# Patient Record
Sex: Female | Born: 1990 | Race: White | Hispanic: No | Marital: Single | Smoking: Former smoker
Health system: Southern US, Community
[De-identification: ages and names within clinical notes are randomized; demographics above are authoritative.]

## PROBLEM LIST (undated history)

## (undated) DIAGNOSIS — R112 Nausea with vomiting, unspecified: Secondary | ICD-10-CM

## (undated) DIAGNOSIS — Z9889 Other specified postprocedural states: Secondary | ICD-10-CM

## (undated) HISTORY — PX: WISDOM TOOTH EXTRACTION: SHX21

## (undated) HISTORY — PX: TONSILLECTOMY: SUR1361

---
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
## 2012-04-10 IMAGING — CT CT HEAD W/O CM
3 of 7 series · 15 of 47 positions shown, 18 images · non-contrast
Comparison: None.

CT HEAD

CLINICAL DATA: Softball injury with pain in the right zygomatic
arch region.

CT HEAD WITHOUT CONTRAST
CT MAXILLOFACIAL WITHOUT CONTRAST
TECHNIQUE: Multidetector CT imaging of the head and maxillofacial
structures were performed using the standard protocol without
intravenous contrast. Multiplanar CT image reconstructions of the
maxillofacial structures were also generated.

[Series 4: facial bones · axial · 0.34mm/px · z∈[+28,+148]mm · 11 of 73 slices shown, 14 images]
[im 7/73  brain]
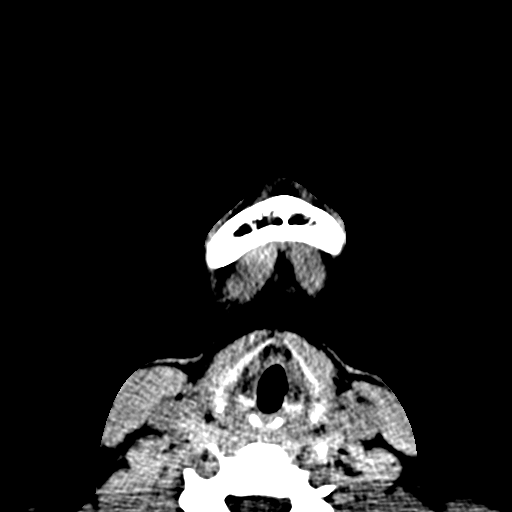
[im 7/73  bone]
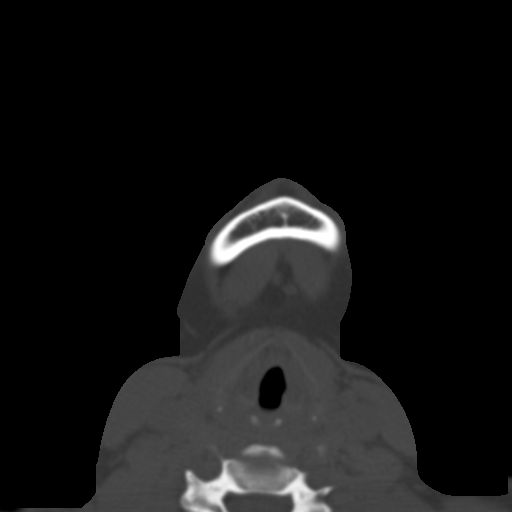
[im 13/73  brain]
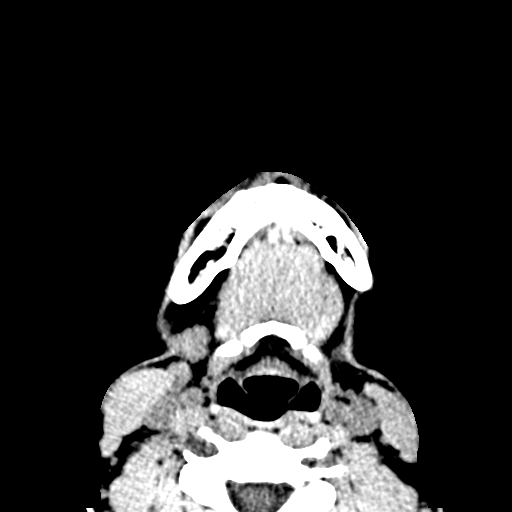
[im 19/73  brain]
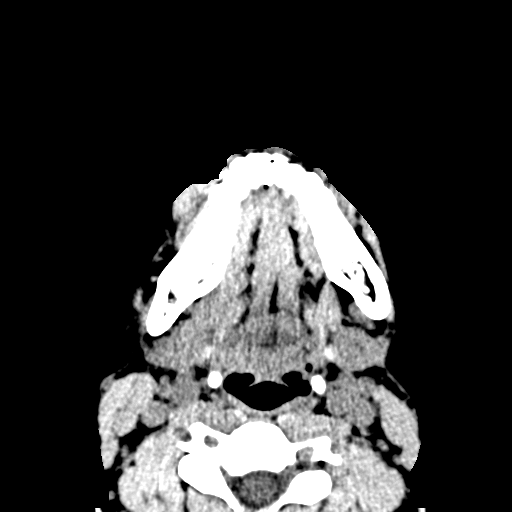
[im 25/73  brain]
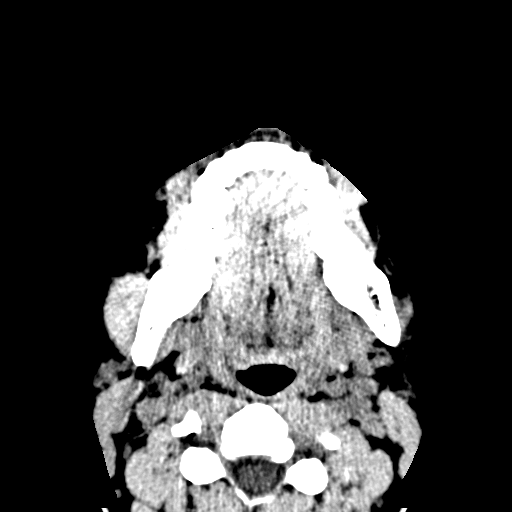
[im 31/73  brain]
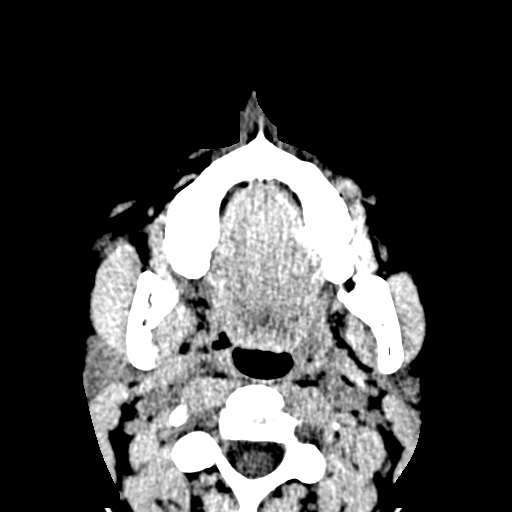
[im 31/73  bone]
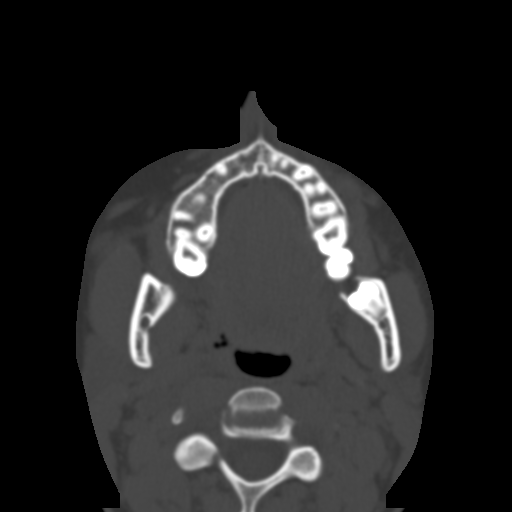
[im 37/73  brain]
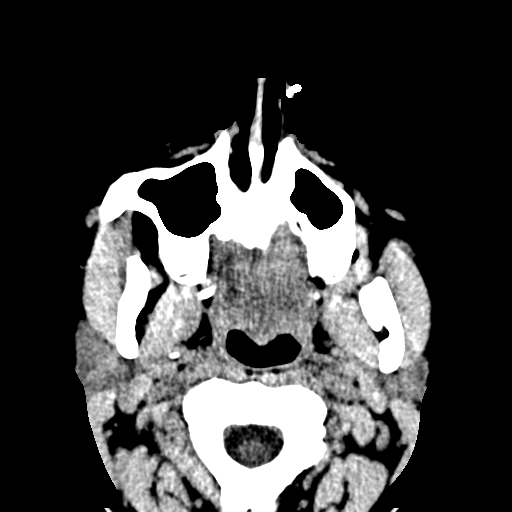
[im 43/73  brain]
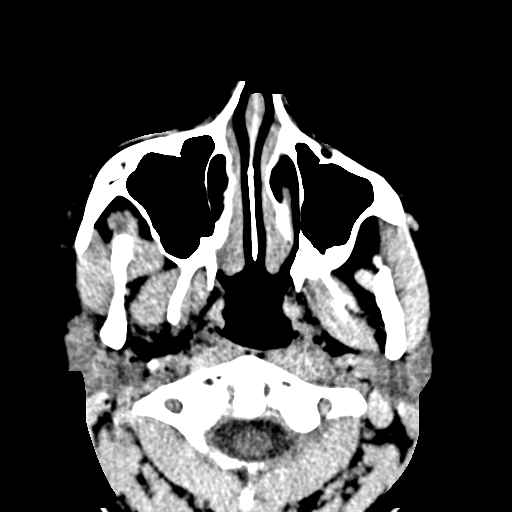
[im 49/73  brain]
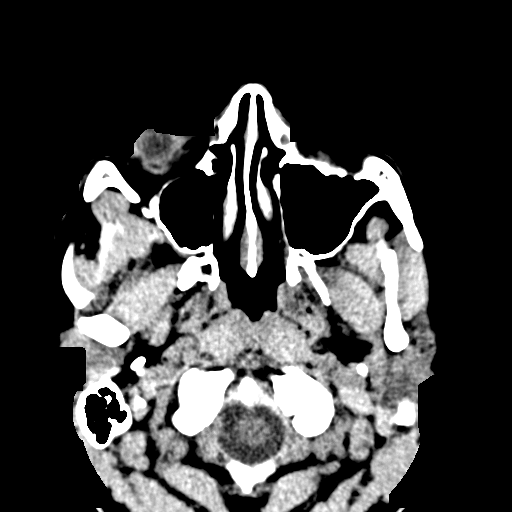
[im 55/73  brain]
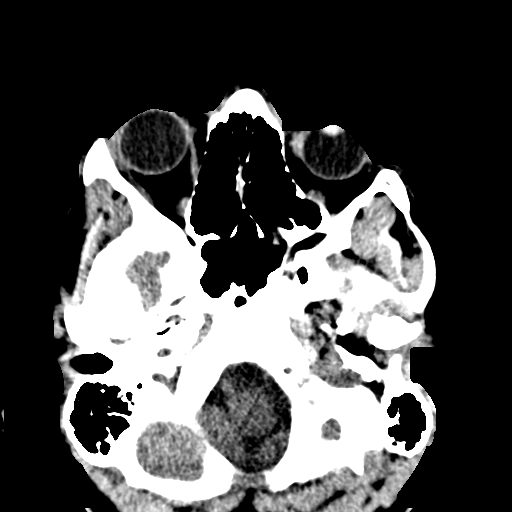
[im 55/73  bone]
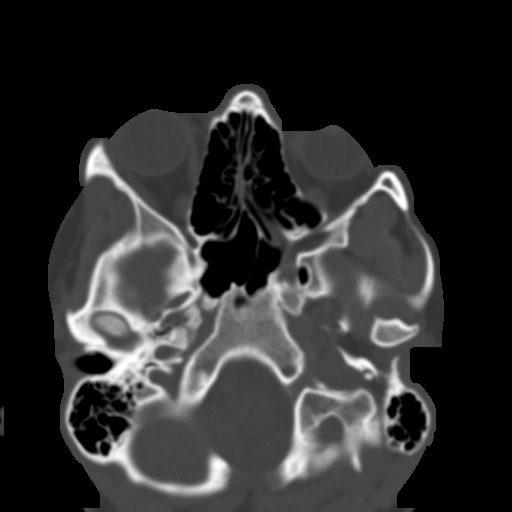
[im 61/73  brain]
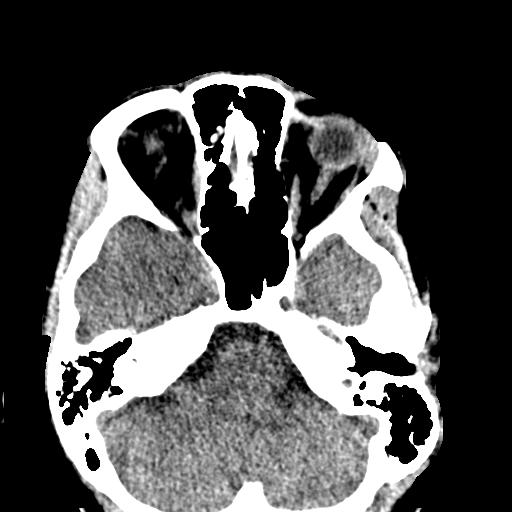
[im 67/73  brain]
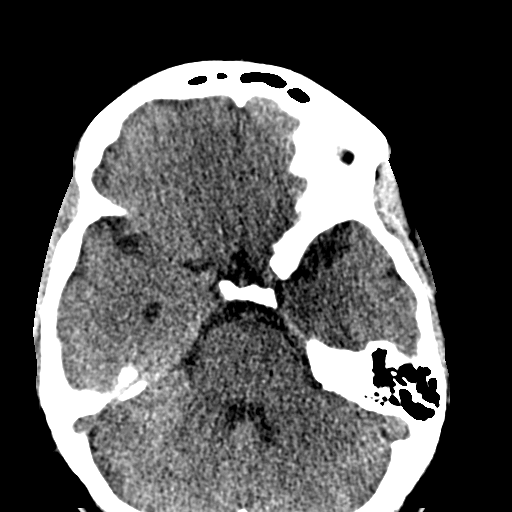

[cor soft · coronal · 0.34mm/px · 3 of 77 slices shown]
[im 20/77  brain]
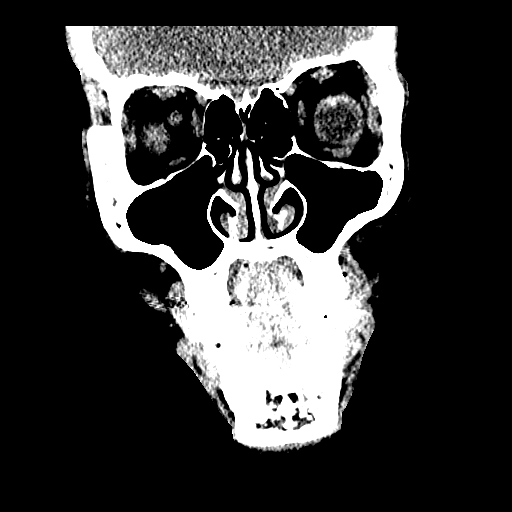
[im 39/77  brain]
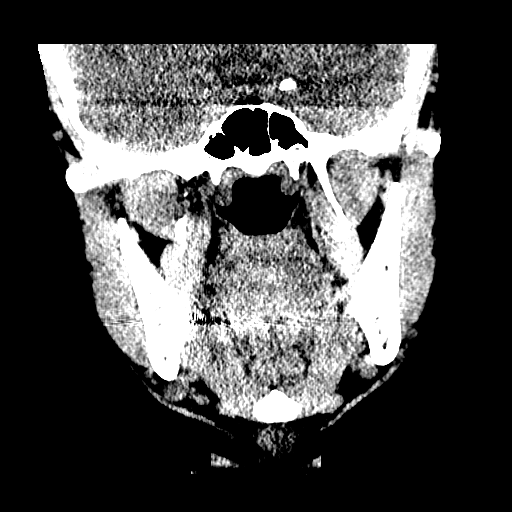
[im 58/77  brain]
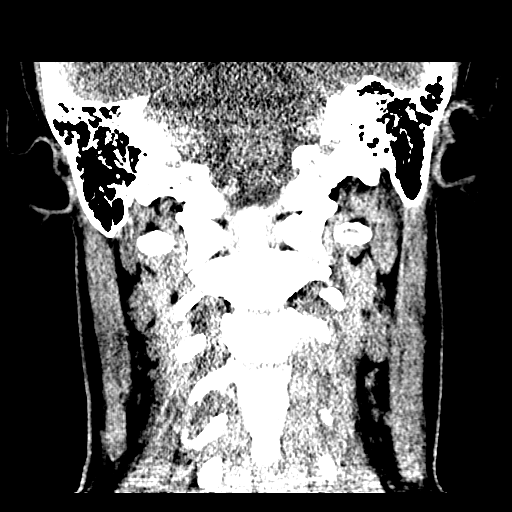

[sag soft · sagittal · 0.34mm/px · 1 of 75 slices shown]
[im 38/75  brain]
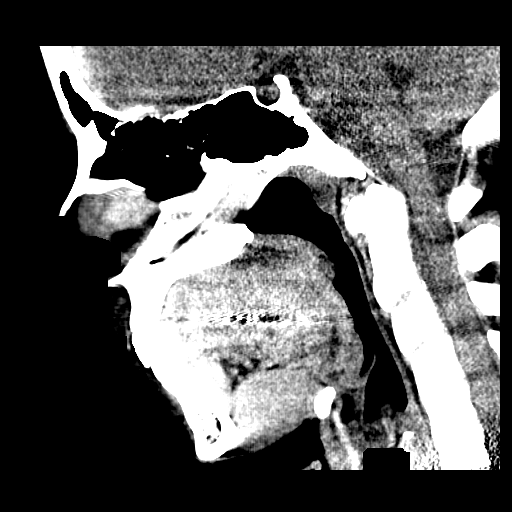

[15 of 47 positions shown; findings below may reference images not displayed]

FINDINGS: There is no evidence of acute intracranial hemorrhage,
mass lesion, brain edema or extra-axial fluid collection.  The
ventricles and subarachnoid spaces are appropriately sized for age.
There is no CT evidence of acute cortical infarction.

The visualized paranasal sinuses are clear. The calvarium is
intact.
IMPRESSION: No acute intracranial or calvarial findings.

CT MAXILLOFACIAL
FINDINGS: There is some soft tissue swelling in the subcutaneous
fat overlying the right zygomatic arch.  No underlying facial
fracture is demonstrated. The mandible and temporomandibular joints
are intact.  The paranasal sinuses, mastoid air cells and middle
ears are clear.  Some air near the medial epicanthic fold of the
right orbit is probably physiologic.  The globes are intact.
IMPRESSION: Right malar soft tissue swelling.  No evidence of facial fracture
or orbital hematoma.

## 2012-06-04 ENCOUNTER — Ambulatory Visit (INDEPENDENT_AMBULATORY_CARE_PROVIDER_SITE_OTHER): Payer: BC Managed Care – PPO | Admitting: Emergency Medicine

## 2012-06-04 VITALS — BP 119/82 | HR 102 | Temp 97.6°F | Resp 22 | Ht 66.5 in | Wt 169.0 lb

## 2012-06-04 DIAGNOSIS — R52 Pain, unspecified: Secondary | ICD-10-CM

## 2012-06-04 DIAGNOSIS — R109 Unspecified abdominal pain: Secondary | ICD-10-CM

## 2012-06-04 DIAGNOSIS — R112 Nausea with vomiting, unspecified: Secondary | ICD-10-CM

## 2012-06-04 LAB — POCT CBC
Lymph, poc: 0.9 (ref 0.6–3.4)
MCH, POC: 28 pg (ref 27–31.2)
MCHC: 30.4 g/dL — AB (ref 31.8–35.4)
MID (cbc): 0.2 (ref 0–0.9)
MPV: 9.6 fL (ref 0–99.8)
POC Granulocyte: 4.6 (ref 2–6.9)
POC MID %: 0.2 %M (ref 0–12)
Platelet Count, POC: 301 10*3/uL (ref 142–424)
WBC: 5.3 10*3/uL (ref 4.6–10.2)

## 2012-06-04 MED ORDER — ONDANSETRON 8 MG PO TBDP: 8.0000 mg | ORAL_TABLET | Freq: Three times a day (TID) | ORAL | Status: DC | PRN

## 2012-06-04 MED ORDER — ONDANSETRON 8 MG PO TBDP: 8.0000 mg | ORAL_TABLET | Freq: Three times a day (TID) | ORAL | Status: AC | PRN

## 2012-06-04 MED ORDER — ONDANSETRON 4 MG PO TBDP
4.0000 mg | ORAL_TABLET | Freq: Once | ORAL | Status: AC
  Administered 2012-06-04: 8 mg via ORAL

## 2012-06-04 NOTE — Patient Instructions (Signed)
Nausea and Vomiting  Nausea is a sick feeling that often comes before throwing up (vomiting). Vomiting is a reflex where stomach contents come out of your mouth. Vomiting can cause severe loss of body fluids (dehydration). Children and elderly adults can become dehydrated quickly, especially if they also have diarrhea. Nausea and vomiting are symptoms of a condition or disease. It is important to find the cause of your symptoms.  CAUSES    Direct irritation of the stomach lining. This irritation can result from increased acid production (gastroesophageal reflux disease), infection, food poisoning, taking certain medicines (such as nonsteroidal anti-inflammatory drugs), alcohol use, or tobacco use.   Signals from the brain.These signals could be caused by a headache, heat exposure, an inner ear disturbance, increased pressure in the brain from injury, infection, a tumor, or a concussion, pain, emotional stimulus, or metabolic problems.   An obstruction in the gastrointestinal tract (bowel obstruction).   Illnesses such as diabetes, hepatitis, gallbladder problems, appendicitis, kidney problems, cancer, sepsis, atypical symptoms of a heart attack, or eating disorders.   Medical treatments such as chemotherapy and radiation.   Receiving medicine that makes you sleep (general anesthetic) during surgery.  DIAGNOSIS  Your caregiver may ask for tests to be done if the problems do not improve after a few days. Tests may also be done if symptoms are severe or if the reason for the nausea and vomiting is not clear. Tests may include:   Urine tests.   Blood tests.   Stool tests.   Cultures (to look for evidence of infection).   X-rays or other imaging studies.  Test results can help your caregiver make decisions about treatment or the need for additional tests.  TREATMENT  You need to stay well hydrated. Drink frequently but in small amounts.You may wish to drink water, sports drinks, clear broth, or eat frozen  ice pops or gelatin dessert to help stay hydrated.When you eat, eating slowly may help prevent nausea.There are also some antinausea medicines that may help prevent nausea.  HOME CARE INSTRUCTIONS    Take all medicine as directed by your caregiver.   If you do not have an appetite, do not force yourself to eat. However, you must continue to drink fluids.   If you have an appetite, eat a normal diet unless your caregiver tells you differently.   Eat a variety of complex carbohydrates (rice, wheat, potatoes, bread), lean meats, yogurt, fruits, and vegetables.   Avoid high-fat foods because they are more difficult to digest.   Drink enough water and fluids to keep your urine clear or pale yellow.   If you are dehydrated, ask your caregiver for specific rehydration instructions. Signs of dehydration may include:   Severe thirst.   Dry lips and mouth.   Dizziness.   Dark urine.   Decreasing urine frequency and amount.   Confusion.   Rapid breathing or pulse.  SEEK IMMEDIATE MEDICAL CARE IF:    You have blood or brown flecks (like coffee grounds) in your vomit.   You have black or bloody stools.   You have a severe headache or stiff neck.   You are confused.   You have severe abdominal pain.   You have chest pain or trouble breathing.   You do not urinate at least once every 8 hours.   You develop cold or clammy skin.   You continue to vomit for longer than 24 to 48 hours.   You have a fever.  MAKE SURE YOU:      Understand these instructions.   Will watch your condition.   Will get help right away if you are not doing well or get worse.  Document Released: 10/11/2005 Document Revised: 09/30/2011 Document Reviewed: 03/10/2011  Exit[employer] Patient Information 2012 [employer].

## 2012-06-04 NOTE — Progress Notes (Signed)
21 year old [place] female is here today with complaints of nausea and vomiting since 12 midnight. Pt states that she went out last night and was drinking Barcardi Light alcohol. Pt states she only ate a sandwich yesterday around 6pm. Pt's roommate states the vomiting started every hour for a little, then every thirty minutes a little, now its every 10 minutes.. Pt's roommate is with her. Pt states she has no other complaints.

## 2012-06-04 NOTE — Progress Notes (Signed)
  Subjective:    Patient ID: Dana Calhoun, female    DOB: 01-Jul-1991, 21 y.o.   MRN: [id number]  HPI patient enters with uncontrolled nausea vomiting and retching. She had chicken salad from a [employer] last night at about 6 PM. She went on to have some drinks with friends later in the evening but says she really did not drink that much. About 2 AM she started with nausea and began retching and this has continued to her office visit here this morning. She has a history of a sensitive stomach. She has no history of ulcer disease. She has not vomited up any blood.    Review of Systems patient is on birth control pills to     Objective:   Physical Exam  Constitutional: She appears well-developed and well-nourished.  HENT:  Right Ear: External ear normal.  Left Ear: External ear normal.  Eyes: Pupils are equal, round, and reactive to light.  Neck: Normal range of motion. No tracheal deviation present. No thyromegaly present.  Cardiovascular: Normal rate and regular rhythm.  Exam reveals no gallop and no friction rub.   No murmur heard. Pulmonary/Chest: Effort normal and breath sounds normal.  Abdominal: Soft. Bowel sounds are normal. She exhibits no distension. There is no tenderness.   Results for orders placed in visit on 06/04/12  POCT CBC      Component Value Range   WBC 5.3  4.6 - 10.2 K/uL   Lymph, poc 0.9  0.6 - 3.4   POC LYMPH PERCENT 16.4  10 - 50 %L   MID (cbc) 0.2  0 - 0.9   POC MID % 0.2  0 - 12 %M   POC Granulocyte 4.6  2 - 6.9   Granulocyte percent 79.0  37 - 80 %G   RBC 3.68 (*) 4.04 - 5.48 M/uL   Hemoglobin 10.3 (*) 12.2 - 16.2 g/dL   HCT, POC 40.1 (*) 02.7 - 47.9 %   MCV 92.2  80 - 97 fL   MCH, POC 28.0  27 - 31.2 pg   MCHC 30.4 (*) 31.8 - 35.4 g/dL   RDW, POC 25.3     Platelet Count, POC 301  142 - 424 K/uL   MPV 9.6  0 - 99.8 fL         Assessment & Plan:   Patient treated with Zofran 2 L IV fluids. Following treatment she looked remarkably better. We'll  call in a prescription for Zofran for her to have as needed she will be on clear liquids and crackers today.

## 2013-06-27 ENCOUNTER — Encounter: Payer: Self-pay | Admitting: Family

## 2013-06-27 ENCOUNTER — Ambulatory Visit (INDEPENDENT_AMBULATORY_CARE_PROVIDER_SITE_OTHER): Payer: BC Managed Care – PPO | Admitting: Family

## 2013-06-27 VITALS — BP 118/70 | HR 81 | Temp 98.3°F | Resp 16 | Ht 67.5 in | Wt 177.0 lb

## 2013-06-27 DIAGNOSIS — N926 Irregular menstruation, unspecified: Secondary | ICD-10-CM

## 2013-06-27 DIAGNOSIS — B353 Tinea pedis: Secondary | ICD-10-CM

## 2013-06-27 MED ORDER — FLUCONAZOLE 150 MG PO TABS: ORAL_TABLET | ORAL | Status: DC

## 2013-06-27 NOTE — Progress Notes (Signed)
Subjective:    Patient ID: Dana Calhoun, female    DOB: 1991-01-31, 22 y.o.   MRN: [id number]  HPI  Dana Calhoun is a 22 yr old female who presents today to establish care.  She has two main concerns today:  1) Hx of irregular menstrual periods and dysmenorrhea- has been on an OCP since the age of 76.  Reports that since starting OCP cramping has improved. She is bothered however by the breakthrough bleeding that she has on jolessa.  She reports that she is homosexual and not using for birth control.  She has never had a pap smear.   2) Athetes foot.  Reports that she played softball and the athlete's foot started her freshman year.  Has tried some otc sprays, creams.  Some improvement with cream but it comes right back.    Review of Systems  Constitutional: Negative for unexpected weight change.  HENT: Negative for congestion.   Respiratory: Negative for cough.   Cardiovascular: Negative for palpitations.  Gastrointestinal:       Reports that she has seen GI in the past uses bentyl prn.  She reports that she cannot tolerate scrambled eggs, alcohol due to nausea.   Genitourinary: Negative for dysuria and frequency.  Musculoskeletal: Negative for myalgias and arthralgias.  Skin:       See hpi  Neurological: Negative for headaches.  Hematological: Negative for adenopathy.  Psychiatric/Behavioral:       Denies depression/anxiety   History reviewed. No pertinent past medical history.  History   Social History  . Marital Status: Single    Spouse Name: N/A    Number of Children: N/A  . Years of Education: N/A   Occupational History  . Not on file.   Social History Main Topics  . Smoking status: Former Smoker -- 1.00 packs/day    Types: Cigarettes    Quit date: 06/27/2012  . Smokeless tobacco: Not on file  . Alcohol Use: No     Comment: socially  . Drug Use: No  . Sexual Activity: Yes   Other Topics Concern  . Not on file   Social History Narrative   Lives with her two  roommates Annabelle Harman and Lequita Halt   She is a Consulting civil engineer at [employer], biology major   Grew up in [place]   1 brother 5 years younger          Past Surgical History  Procedure Laterality Date  . Tonsillectomy      History reviewed. No pertinent family history.  Allergies  Allergen Reactions  . Codeine     No current outpatient prescriptions on file prior to visit.   No current facility-administered medications on file prior to visit.    BP 118/70  Pulse 81  Temp(Src) 98.3 F (36.8 C) (Oral)  Resp 16  Ht 5' 7.5" (1.715 m)  Wt 177 lb (80.287 kg)  BMI 27.3 kg/m2  SpO2 99%  LMP 04/26/2013       Objective:   Physical Exam  Constitutional: She is oriented to person, place, and time. She appears well-developed and well-nourished. No distress.  HENT:  Head: Normocephalic and atraumatic.  Cardiovascular: Normal rate and regular rhythm.   No murmur heard. Pulmonary/Chest: Effort normal and breath sounds normal. No respiratory distress. She has no wheezes. She has no rales. She exhibits no tenderness.  Abdominal: Soft. She exhibits no distension.  Musculoskeletal: She exhibits no edema.  Neurological: She is alert and oriented to person, place, and time.  Skin:  Skin is warm and dry.  + dry scaling skin, plantar surface of left foot near base of toes  Psychiatric: She has a normal mood and affect. Her behavior is normal. Judgment and thought content normal.          Assessment & Plan:

## 2013-06-27 NOTE — Assessment & Plan Note (Signed)
She wishes trial off of meds for now.  Will monitor.  Could consider 28 day cycle if she wishes to resume OCP at some point.  Plan pap smear at 23.

## 2013-06-27 NOTE — Assessment & Plan Note (Signed)
Trial of fluconazole.  Continue topical preparations.

## 2013-06-27 NOTE — Patient Instructions (Addendum)
Start diflucan for athletes foot. You can continue to apply otc antifungal spray and cream. Stop birth control pill. Follow up in February for complete physical with pap smear.

## 2014-01-27 ENCOUNTER — Encounter (HOSPITAL_COMMUNITY): Payer: Self-pay | Admitting: Emergency Medicine

## 2014-01-27 ENCOUNTER — Emergency Department (HOSPITAL_COMMUNITY)
Admission: EM | Admit: 2014-01-27 | Discharge: 2014-01-27 | Disposition: A | Discharge status: Home / Self Care | Payer: BC Managed Care – PPO | Source: Home / Self Care | Attending: Emergency Medicine | Admitting: Emergency Medicine

## 2014-01-27 DIAGNOSIS — K2920 Alcoholic gastritis without bleeding: Secondary | ICD-10-CM

## 2014-01-27 MED ORDER — ONDANSETRON HCL 4 MG/2ML IJ SOLN
INTRAMUSCULAR | Status: AC
  Filled 2014-01-27: qty 2

## 2014-01-27 MED ORDER — SODIUM CHLORIDE 0.9 % IV SOLN
Freq: Once | INTRAVENOUS | Status: AC
  Administered 2014-01-27: 11:00:00 via INTRAVENOUS

## 2014-01-27 MED ORDER — ONDANSETRON HCL 4 MG/2ML IJ SOLN: 8.0000 mg | Freq: Once | INTRAMUSCULAR | Status: DC

## 2014-01-27 MED ORDER — ONDANSETRON HCL 4 MG PO TABS: 4.0000 mg | ORAL_TABLET | Freq: Three times a day (TID) | ORAL | Status: DC | PRN

## 2014-01-27 MED ORDER — ONDANSETRON HCL 4 MG/2ML IJ SOLN
4.0000 mg | Freq: Once | INTRAMUSCULAR | Status: AC
  Administered 2014-01-27: 4 mg via INTRAVENOUS

## 2014-01-27 NOTE — ED Notes (Signed)
IV removed, site clean, dry, catheter intact, no redness or swelling. Dressed site with sterile 2x2 gauze + paper tape

## 2014-01-27 NOTE — ED Notes (Signed)
Pt    Reports      Had too  Much  To  Drink last  Pm    And    Has  Been  Vomiting  And  Retching         For about  6  Hours         denys  Any pain

## 2014-01-27 NOTE — ED Provider Notes (Signed)
CSN: [id number]     Arrival date & time 01/27/14  0944 History   First MD Initiated Contact with Patient 01/27/14 1100     Chief Complaint  Patient presents with  . Emesis   (Consider location/radiation/quality/duration/timing/severity/associated sxs/prior Treatment) Patient is a [age] y.o. female presenting with vomiting. The history is provided by the patient.  Emesis Severity:  Moderate Duration:  1 day Timing:  Constant Quality:  Stomach contents Progression:  Unchanged Chronicity:  New Risk factors: alcohol use   Risk factors comment:  Endorses heavy drinking last night   History reviewed. No pertinent past medical history. Past Surgical History  Procedure Laterality Date  . Tonsillectomy     History reviewed. No pertinent family history. History  Substance Use Topics  . Smoking status: Former Smoker -- 1.00 packs/day    Types: Cigarettes    Quit date: 06/27/2012  . Smokeless tobacco: Not on file  . Alcohol Use: No     Comment: socially   OB History   Grav Para Term Preterm Abortions TAB SAB Ect Mult Living                 Review of Systems  Gastrointestinal: Positive for vomiting.  All other systems reviewed and are negative.    Allergies  Codeine  Home Medications   Current Outpatient Rx  Name  Route  Sig  Dispense  Refill  . dicyclomine (BENTYL) 10 MG capsule   Oral   Take 10 mg by mouth 4 (four) times daily -  before meals and at bedtime.         . fluconazole (DIFLUCAN) 150 MG tablet      One tablet by mouth once weekly x 3 weeks.   3 tablet   0   . ondansetron (ZOFRAN) 4 MG tablet   Oral   Take 1 tablet (4 mg total) by mouth every 8 (eight) hours as needed for nausea or vomiting.   12 tablet   0    BP 124/81  Pulse 85  Temp(Src) 96.9 F (36.1 C) (Oral)  Resp 28  SpO2 100%  LMP 01/10/2014 Physical Exam  Nursing note and vitals reviewed. Constitutional: She is oriented to person, place, and time. She appears well-developed and  well-nourished.  Persistent vomiting in exam room  HENT:  Head: Normocephalic and atraumatic.  Eyes: Conjunctivae are normal. No scleral icterus.  Cardiovascular: Normal rate.   Pulmonary/Chest: Effort normal.  Abdominal: Soft. Bowel sounds are normal. She exhibits no distension. There is no tenderness.  Musculoskeletal: Normal range of motion.  Neurological: She is alert and oriented to person, place, and time.  Skin: Skin is warm and dry.  Psychiatric: She has a normal mood and affect. Her behavior is normal.    ED Course  Procedures (including critical care time) Labs Review Labs Reviewed - No data to display Imaging Review No results found.   MDM   1. Gastritis, alcoholic    IV NS x 1L given with zofran 4 mg IV. Vomiting resolved and patient reports she is feeling significantly better.     Jess BartersJennifer Lee Fords PrairiePress[place], [place] 01/27/14 [phone]

## 2014-01-27 NOTE — Discharge Instructions (Signed)

## 2014-01-27 NOTE — ED Provider Notes (Signed)
Medical screening examination/treatment/procedure(s) were performed by non-physician practitioner and as supervising physician I was immediately available for consultation/collaboration.  Rinaldo Macqueen, M.D.  Tashiba Timoney C Tramond Slinker, MD 01/27/14 2113 

## 2014-01-27 NOTE — ED Notes (Signed)
Iv  Ns  1  Liter  Bolus  Via  20  Angio  l   Arm  1  Att

## 2014-11-11 ENCOUNTER — Encounter (HOSPITAL_COMMUNITY): Payer: Self-pay | Admitting: Emergency Medicine

## 2014-11-11 ENCOUNTER — Emergency Department (HOSPITAL_COMMUNITY)
Admission: EM | Admit: 2014-11-11 | Discharge: 2014-11-11 | Disposition: A | Discharge status: Home / Self Care | Payer: BLUE CROSS/BLUE SHIELD | Source: Home / Self Care | Attending: Family Medicine | Admitting: Family Medicine

## 2014-11-11 DIAGNOSIS — J0100 Acute maxillary sinusitis, unspecified: Secondary | ICD-10-CM

## 2014-11-11 LAB — POCT RAPID STREP A: Streptococcus, Group A Screen (Direct): NEGATIVE

## 2014-11-11 MED ORDER — PREDNISONE 10 MG PO TABS: 30.0000 mg | ORAL_TABLET | Freq: Every day | ORAL | Status: DC

## 2014-11-11 MED ORDER — IPRATROPIUM BROMIDE 0.06 % NA SOLN: 2.0000 | Freq: Four times a day (QID) | NASAL | Status: DC

## 2014-11-11 NOTE — ED Provider Notes (Signed)
Dana Calhoun is a 24 y.o. female who presents to Urgent Care today for sore throat. Patient has a several day history of sore throat associated with body aches and congestion and fever. Temperature max was 101F last night. No vomiting or diarrhea. Patient has tried Sudafed and Advil which helped a bit.   History reviewed. No pertinent past medical history. Past Surgical History  Procedure Laterality Date  . Tonsillectomy     History  Substance Use Topics  . Smoking status: Former Smoker -- 1.00 packs/day    Types: Cigarettes    Quit date: 06/27/2012  . Smokeless tobacco: Not on file  . Alcohol Use: No     Comment: socially   ROS as above Medications: No current facility-administered medications for this encounter.   Current Outpatient Prescriptions  Medication Sig Dispense Refill  . dicyclomine (BENTYL) 10 MG capsule Take 10 mg by mouth 4 (four) times daily -  before meals and at bedtime.    . fluconazole (DIFLUCAN) 150 MG tablet One tablet by mouth once weekly x 3 weeks. 3 tablet 0  . ipratropium (ATROVENT) 0.06 % nasal spray Place 2 sprays into both nostrils 4 (four) times daily. 15 mL 1  . ondansetron (ZOFRAN) 4 MG tablet Take 1 tablet (4 mg total) by mouth every 8 (eight) hours as needed for nausea or vomiting. 12 tablet 0  . predniSONE (DELTASONE) 10 MG tablet Take 3 tablets (30 mg total) by mouth daily. 15 tablet 0   Allergies  Allergen Reactions  . Codeine      Exam:  BP 108/70 mmHg  Pulse 89  Temp(Src) 98.3 F (36.8 C) (Oral)  Resp 16  SpO2 96%  LMP 10/21/2014 Gen: Well NAD HEENT: EOMI,  MMM posterior pharynx is erythematous. Normal tympanic membranes bilaterally. Patient has palpable anterior cervical lymphadenopathy present bilaterally. Mildly tender maxillary sinus palpation bilaterally Lungs: Normal work of breathing. CTABL Heart: RRR no MRG Abd: NABS, Soft. Nondistended, Nontender Exts: Brisk capillary refill, warm and well perfused.   Results for  orders placed or performed during the hospital encounter of 11/11/14 (from the past 24 hour(s))  POCT rapid strep A [facility])     Status: None   Collection Time: 11/11/14  1:45 PM  Result Value Ref Range   Streptococcus, Group A Screen (Direct) NEGATIVE NEGATIVE   No results found.  Assessment and Plan: 24 y.o. female with viral sinusitis/bronchitis. Treat with prednisone and Atrovent nasal spray.  Discussed warning signs or symptoms. Please see discharge instructions. Patient expresses understanding.     Rodolph BongEvan S Elianis Fischbach, MD 11/11/14 [phone]

## 2014-11-11 NOTE — Discharge Instructions (Signed)
Thank you for coming in today. °Call or go to the emergency room if you get worse, have trouble breathing, have chest pains, or palpitations.  ° °Sinusitis °Sinusitis is redness, soreness, and inflammation of the paranasal sinuses. Paranasal sinuses are air pockets within the bones of your face (beneath the eyes, the middle of the forehead, or above the eyes). In healthy paranasal sinuses, mucus is able to drain out, and air is able to circulate through them by way of your nose. However, when your paranasal sinuses are inflamed, mucus and air can become trapped. This can allow bacteria and other germs to grow and cause infection. °Sinusitis can develop quickly and last only a short time (acute) or continue over a long period (chronic). Sinusitis that lasts for more than 12 weeks is considered chronic.  °CAUSES  °Causes of sinusitis include: °· Allergies. °· Structural abnormalities, such as displacement of the cartilage that separates your nostrils (deviated septum), which can decrease the air flow through your nose and sinuses and affect sinus drainage. °· Functional abnormalities, such as when the small hairs (cilia) that line your sinuses and help remove mucus do not work properly or are not present. °SIGNS AND SYMPTOMS  °Symptoms of acute and chronic sinusitis are the same. The primary symptoms are pain and pressure around the affected sinuses. Other symptoms include: °· Upper toothache. °· Earache. °· Headache. °· Bad breath. °· Decreased sense of smell and taste. °· A cough, which worsens when you are lying flat. °· Fatigue. °· Fever. °· Thick drainage from your nose, which often is green and may contain pus (purulent). °· Swelling and warmth over the affected sinuses. °DIAGNOSIS  °Your health care provider will perform a physical exam. During the exam, your health care provider may: °· Look in your nose for signs of abnormal growths in your nostrils (nasal polyps). °· Tap over the affected sinus to check for  signs of infection. °· View the inside of your sinuses (endoscopy) using an imaging device that has a light attached (endoscope). °If your health care provider suspects that you have chronic sinusitis, one or more of the following tests may be recommended: °· Allergy tests. °· Nasal culture. A sample of mucus is taken from your nose, sent to a lab, and screened for bacteria. °· Nasal cytology. A sample of mucus is taken from your nose and examined by your health care provider to determine if your sinusitis is related to an allergy. °TREATMENT  °Most cases of acute sinusitis are related to a viral infection and will resolve on their own within 10 days. Sometimes medicines are prescribed to help relieve symptoms (pain medicine, decongestants, nasal steroid sprays, or saline sprays).  °However, for sinusitis related to a bacterial infection, your health care provider will prescribe antibiotic medicines. These are medicines that will help kill the bacteria causing the infection.  °Rarely, sinusitis is caused by a fungal infection. In theses cases, your health care provider will prescribe antifungal medicine. °For some cases of chronic sinusitis, surgery is needed. Generally, these are cases in which sinusitis recurs more than 3 times per year, despite other treatments. °HOME CARE INSTRUCTIONS  °· Drink plenty of water. Water helps thin the mucus so your sinuses can drain more easily. °· Use a humidifier. °· Inhale steam 3 to 4 times a day (for example, sit in the bathroom with the shower running). °· Apply a warm, moist washcloth to your face 3 to 4 times a day, or as directed by your   health care provider. °· Use saline nasal sprays to help moisten and clean your sinuses. °· Take medicines only as directed by your health care provider. °· If you were prescribed either an antibiotic or antifungal medicine, finish it all even if you start to feel better. °SEEK IMMEDIATE MEDICAL CARE IF: °· You have increasing pain or  severe headaches. °· You have nausea, vomiting, or drowsiness. °· You have swelling around your face. °· You have vision problems. °· You have a stiff neck. °· You have difficulty breathing. °MAKE SURE YOU:  °· Understand these instructions. °· Will watch your condition. °· Will get help right away if you are not doing well or get worse. °Document Released: 10/11/2005 Document Revised: 02/25/2014 Document Reviewed: 10/26/2011 °[employer]® Patient Information ©2015 [employer]. This information is not intended to replace advice given to you by your health care provider. Make sure you discuss any questions you have with your health care provider. ° °

## 2014-11-11 NOTE — ED Notes (Signed)
C/o cold sx onset 1 week Sx include: ST, runny nose, congestion, BA, fevers, dizziness Taking OTC cold meds w/no relief Alert, no signs of acute distress.

## 2014-11-13 LAB — CULTURE, GROUP A STREP

## 2015-06-02 NOTE — Pre-Procedure Instructions (Signed)
Rasheen Schewe  06/02/2015     Your procedure is scheduled on : Friday June 06, 2015 at 3:30 PM.  Report to [facility] Admitting at 1:30 PM.  Call this number if you have problems the morning of surgery: [phone]   Remember:  Do not eat food or drink liquids after midnight.  Take these medicines the morning of surgery with A SIP OF WATER : NONE   Do not wear jewelry, make-up or nail polish.  Do not wear lotions, powders, or perfumes.    Do not shave 48 hours prior to surgery.    Do not bring valuables to the hospital.  [facility] is not responsible for any belongings or valuables.  Contacts, dentures or bridgework may not be worn into surgery.  Leave your suitcase in the car.  After surgery it may be brought to your room.  For patients admitted to the hospital, discharge time will be determined by your treatment team.  Patients discharged the day of surgery will not be allowed to drive home.   Name and phone number of your driver:    Special instructions:  Shower using CHG soap the night before and the morning of your surgery  Please read over the following fact sheets that you were given. Pain Booklet, Coughing and Deep Breathing and Surgical Site Infection Prevention

## 2015-06-03 ENCOUNTER — Encounter (HOSPITAL_COMMUNITY): Payer: Self-pay

## 2015-06-03 ENCOUNTER — Encounter (HOSPITAL_COMMUNITY)
Admission: RE | Admit: 2015-06-03 | Discharge: 2015-06-03 | Disposition: A | Discharge status: Home / Self Care | Payer: BLUE CROSS/BLUE SHIELD | Source: Ambulatory Visit | Attending: Orthopedic Surgery | Admitting: Orthopedic Surgery

## 2015-06-03 DIAGNOSIS — S62326A Displaced fracture of shaft of fifth metacarpal bone, right hand, initial encounter for closed fracture: Secondary | ICD-10-CM | POA: Diagnosis not present

## 2015-06-03 DIAGNOSIS — Z87891 Personal history of nicotine dependence: Secondary | ICD-10-CM | POA: Diagnosis not present

## 2015-06-03 DIAGNOSIS — X58XXXA Exposure to other specified factors, initial encounter: Secondary | ICD-10-CM | POA: Diagnosis not present

## 2015-06-03 HISTORY — DX: Nausea with vomiting, unspecified: R11.2

## 2015-06-03 HISTORY — DX: Other specified postprocedural states: Z98.890

## 2015-06-03 LAB — CBC
HEMATOCRIT: 36.9 % (ref 36.0–46.0)
HEMOGLOBIN: 12.4 g/dL (ref 12.0–15.0)
MCH: 31.1 pg (ref 26.0–34.0)
MCHC: 33.6 g/dL (ref 30.0–36.0)
MCV: 92.5 fL (ref 78.0–100.0)
Platelets: 255 10*3/uL (ref 150–400)
RBC: 3.99 MIL/uL (ref 3.87–5.11)
RDW: 12.8 % (ref 11.5–15.5)
WBC: 6.2 10*3/uL (ref 4.0–10.5)

## 2015-06-03 LAB — HCG, SERUM, QUALITATIVE: PREG SERUM: NEGATIVE

## 2015-06-05 MED ORDER — CEFAZOLIN SODIUM-DEXTROSE 2-3 GM-% IV SOLR
2.0000 g | INTRAVENOUS | Status: AC
  Administered 2015-06-06: 2 g via INTRAVENOUS
  Filled 2015-06-05: qty 50

## 2015-06-05 MED ORDER — CHLORHEXIDINE GLUCONATE 4 % EX LIQD: 60.0000 mL | CUTANEOUS | Status: DC

## 2015-06-05 NOTE — Brief Op Note (Signed)
06/06/2015  7:17 PM  PATIENT:  Dana Calhoun  24 y.o. female  PRE-OPERATIVE DIAGNOSIS:  RIGHT SMALL FINGER METACARPAL SHAFT FRACTURE  POST-OPERATIVE DIAGNOSIS:  * No post-op diagnosis entered *  PROCEDURE:  Procedure(s): OPEN REDUCTION INTERNAL FIXATION (ORIF) METACARPAL RIGHT SMALL FINGER (Right)  SURGEON:  Surgeon(s) and Role:    * Bradly Bienenstock, MD - Primary  PHYSICIAN ASSISTANT:   ASSISTANTS: none   ANESTHESIA:   general  EBL:     BLOOD ADMINISTERED:none  DRAINS: none   LOCAL MEDICATIONS USED:  NONE  SPECIMEN:  No Specimen  DISPOSITION OF SPECIMEN:  N/A  COUNTS:  YES  TOURNIQUET:  * No tourniquets in log *  DICTATION: .Other Dictation: Dictation Number [id number]  PLAN OF CARE: Discharge to home after PACU  PATIENT DISPOSITION:  PACU - hemodynamically stable.   Delay start of Pharmacological VTE agent (>24hrs) due to surgical blood loss or risk of bleeding: not applicable

## 2015-06-05 NOTE — H&P (Signed)
Ami Mally is an 24 y.o. female.   Chief Complaint: RIGHT SMALL FINGER METACARPAL INJURY HPI: PT FOLLOWED IN OFFICE PT SUSTAINED CLOSED INJURY TO RIGHT SMALL FINGER PT WITH FURTHER FRACTURE DISPLACEMENT  HERE FOR SURGERY TO CORRECT DEFORMITY NO PRIOR SURGERY TO RIGHT HAND  Past Medical History  Diagnosis Date  . PONV (postoperative nausea and vomiting)     Past Surgical History  Procedure Laterality Date  . Tonsillectomy    . Wisdom tooth extraction      No family history on file. Social History:  reports that she quit smoking about 2 years ago. Her smoking use included Cigarettes. She smoked 1.00 pack per day. She does not have any smokeless tobacco history on file. She reports that she drinks alcohol. She reports that she does not use illicit drugs.  Allergies:  Allergies  Allergen Reactions  . Codeine Hives and Nausea Only  . Tramadol Nausea And Vomiting    No prescriptions prior to admission    No results found for this or any previous visit (from the past 48 hour(s)). No results found.  ROS NO RECENT ILLNESSES OR HOSPITALIZATIONS  There were no vitals taken for this visit. Physical Exam  General Appearance:  Alert, cooperative, no distress, appears stated age  Head:  Normocephalic, without obvious abnormality, atraumatic  Eyes:  Pupils equal, conjunctiva/corneas clear,         Throat: Lips, mucosa, and tongue normal; teeth and gums normal  Neck: No visible masses     Lungs:   respirations unlabored  Chest Wall:  No tenderness or deformity  Heart:  Regular rate and rhythm,  Abdomen:   Soft, non-tender,         Extremities: RIGHT HAND: DEFORMITY TO SMALL FINGER METACARPAL SHAFT FINGERS WARM WELL PERFUSED LACKS FULL MOBILITY TO SMALL FINGER FINGER WARM WELL PERFUSED GOOD WRIST AND THUMB MOBILITY  Pulses: 2+ and symmetric  Skin: Skin color, texture, turgor normal, no rashes or lesions     Neurologic: Normal    Assessment/Plan RIGHT SMALL FINGER  METACARPAL SHAFT FRACTURE DISPLACED  RIGHT SMALL FINGER OPEN REDUCTION AND INTERNAL FIXATION AND REPAIR AS INDICATED  R/B/A DISCUSSED WITH PT IN OFFICE.  PT VOICED UNDERSTANDING OF PLAN CONSENT SIGNED DAY OF SURGERY PT SEEN AND EXAMINED PRIOR TO OPERATIVE PROCEDURE/DAY OF SURGERY SITE MARKED. QUESTIONS ANSWERED WILL GO HOME FOLLOWING SURGERY WE ARE PLANNING SURGERY FOR YOUR UPPER EXTREMITY. THE RISKS AND BENEFITS OF SURGERY INCLUDE BUT NOT LIMITED TO BLEEDING INFECTION, DAMAGE TO NEARBY NERVES ARTERIES TENDONS, FAILURE OF SURGERY TO ACCOMPLISH ITS INTENDED GOALS, PERSISTENT SYMPTOMS AND NEED FOR FURTHER SURGICAL INTERVENTION. WITH THIS IN MIND WE WILL PROCEED. I HAVE DISCUSSED WITH THE PATIENT THE PRE AND POSTOPERATIVE REGIMEN AND THE DOS AND DON'TS. PT VOICED UNDERSTANDING AND INFORMED CONSENT SIGNED.  Sharma Covert 06/05/2015, 7:14 PM

## 2015-06-06 ENCOUNTER — Encounter (HOSPITAL_COMMUNITY)
Admission: RE | Disposition: A | Discharge status: Home / Self Care | Payer: Self-pay | Source: Ambulatory Visit | Attending: Orthopedic Surgery

## 2015-06-06 ENCOUNTER — Encounter (HOSPITAL_COMMUNITY): Payer: Self-pay | Admitting: Surgery

## 2015-06-06 ENCOUNTER — Ambulatory Visit (HOSPITAL_COMMUNITY): Payer: BLUE CROSS/BLUE SHIELD | Admitting: Certified Registered"

## 2015-06-06 ENCOUNTER — Ambulatory Visit (HOSPITAL_COMMUNITY)
Admission: RE | Admit: 2015-06-06 | Discharge: 2015-06-06 | Disposition: A | Discharge status: Home / Self Care | Payer: BLUE CROSS/BLUE SHIELD | Source: Ambulatory Visit | Attending: Orthopedic Surgery | Admitting: Orthopedic Surgery

## 2015-06-06 DIAGNOSIS — X58XXXA Exposure to other specified factors, initial encounter: Secondary | ICD-10-CM | POA: Insufficient documentation

## 2015-06-06 DIAGNOSIS — S62326A Displaced fracture of shaft of fifth metacarpal bone, right hand, initial encounter for closed fracture: Secondary | ICD-10-CM | POA: Insufficient documentation

## 2015-06-06 DIAGNOSIS — Z87891 Personal history of nicotine dependence: Secondary | ICD-10-CM | POA: Insufficient documentation

## 2015-06-06 HISTORY — PX: OPEN REDUCTION INTERNAL FIXATION (ORIF) METACARPAL: SHX6234

## 2015-06-06 SURGERY — OPEN REDUCTION INTERNAL FIXATION (ORIF) METACARPAL
Anesthesia: General | Site: Finger | Laterality: Right

## 2015-06-06 MED ORDER — HYDROCODONE-ACETAMINOPHEN 5-325 MG PO TABS
2.0000 | ORAL_TABLET | Freq: Once | ORAL | Status: AC
  Administered 2015-06-06: 2 via ORAL

## 2015-06-06 MED ORDER — DOCUSATE SODIUM 100 MG PO CAPS: 100.0000 mg | ORAL_CAPSULE | Freq: Two times a day (BID) | ORAL | Status: AC

## 2015-06-06 MED ORDER — LIDOCAINE HCL (CARDIAC) 20 MG/ML IV SOLN
INTRAVENOUS | Status: AC
  Filled 2015-06-06: qty 5

## 2015-06-06 MED ORDER — HYDROMORPHONE HCL 1 MG/ML IJ SOLN
0.2500 mg | INTRAMUSCULAR | Status: DC | PRN
  Administered 2015-06-06 (×4): 0.5 mg via INTRAVENOUS

## 2015-06-06 MED ORDER — HYDROCODONE-ACETAMINOPHEN 5-325 MG PO TABS
ORAL_TABLET | ORAL | Status: AC
  Filled 2015-06-06: qty 2

## 2015-06-06 MED ORDER — FENTANYL CITRATE (PF) 100 MCG/2ML IJ SOLN
INTRAMUSCULAR | Status: DC | PRN
  Administered 2015-06-06 (×2): 50 ug via INTRAVENOUS
  Administered 2015-06-06: 100 ug via INTRAVENOUS
  Administered 2015-06-06: 50 ug via INTRAVENOUS

## 2015-06-06 MED ORDER — ONDANSETRON HCL 4 MG/2ML IJ SOLN
INTRAMUSCULAR | Status: AC
  Filled 2015-06-06: qty 2

## 2015-06-06 MED ORDER — FENTANYL CITRATE (PF) 100 MCG/2ML IJ SOLN
50.0000 ug | INTRAMUSCULAR | Status: DC | PRN
  Administered 2015-06-06 (×3): 50 ug via INTRAVENOUS

## 2015-06-06 MED ORDER — SCOPOLAMINE 1 MG/3DAYS TD PT72
MEDICATED_PATCH | TRANSDERMAL | Status: AC
  Administered 2015-06-06: 1 via TRANSDERMAL
  Filled 2015-06-06: qty 1

## 2015-06-06 MED ORDER — 0.9 % SODIUM CHLORIDE (POUR BTL) OPTIME
TOPICAL | Status: DC | PRN
  Administered 2015-06-06: 1000 mL

## 2015-06-06 MED ORDER — PROPOFOL 10 MG/ML IV BOLUS
INTRAVENOUS | Status: AC
  Filled 2015-06-06: qty 20

## 2015-06-06 MED ORDER — MEPERIDINE HCL 25 MG/ML IJ SOLN: 6.2500 mg | INTRAMUSCULAR | Status: DC | PRN

## 2015-06-06 MED ORDER — HYDROCODONE-ACETAMINOPHEN 5-300 MG PO TABS: 1.0000 | ORAL_TABLET | Freq: Four times a day (QID) | ORAL | Status: AC | PRN

## 2015-06-06 MED ORDER — LIDOCAINE HCL (CARDIAC) 20 MG/ML IV SOLN
INTRAVENOUS | Status: DC | PRN
  Administered 2015-06-06: 80 mg via INTRAVENOUS

## 2015-06-06 MED ORDER — BUPIVACAINE HCL (PF) 0.25 % IJ SOLN: INTRAMUSCULAR | Status: DC | PRN

## 2015-06-06 MED ORDER — BUPIVACAINE HCL (PF) 0.25 % IJ SOLN
INTRAMUSCULAR | Status: AC
  Filled 2015-06-06: qty 30

## 2015-06-06 MED ORDER — MIDAZOLAM HCL 5 MG/5ML IJ SOLN
INTRAMUSCULAR | Status: DC | PRN
  Administered 2015-06-06: 2 mg via INTRAVENOUS

## 2015-06-06 MED ORDER — MIDAZOLAM HCL 2 MG/2ML IJ SOLN
INTRAMUSCULAR | Status: AC
  Filled 2015-06-06: qty 4

## 2015-06-06 MED ORDER — FENTANYL CITRATE (PF) 100 MCG/2ML IJ SOLN
INTRAMUSCULAR | Status: AC
  Filled 2015-06-06: qty 2

## 2015-06-06 MED ORDER — DEXAMETHASONE SODIUM PHOSPHATE 4 MG/ML IJ SOLN
INTRAMUSCULAR | Status: AC
  Administered 2015-06-06: 4 mg via INTRAVENOUS
  Filled 2015-06-06: qty 1

## 2015-06-06 MED ORDER — FENTANYL CITRATE (PF) 250 MCG/5ML IJ SOLN
INTRAMUSCULAR | Status: AC
  Filled 2015-06-06: qty 5

## 2015-06-06 MED ORDER — PROMETHAZINE HCL 25 MG/ML IJ SOLN: 6.2500 mg | INTRAMUSCULAR | Status: DC | PRN

## 2015-06-06 MED ORDER — LACTATED RINGERS IV SOLN: INTRAVENOUS | Status: DC

## 2015-06-06 MED ORDER — ONDANSETRON HCL 4 MG/2ML IJ SOLN
INTRAMUSCULAR | Status: DC | PRN
  Administered 2015-06-06: 4 mg via INTRAVENOUS

## 2015-06-06 MED ORDER — LACTATED RINGERS IV SOLN
INTRAVENOUS | Status: DC
  Administered 2015-06-06 (×2): via INTRAVENOUS

## 2015-06-06 MED ORDER — PROPOFOL 10 MG/ML IV BOLUS
INTRAVENOUS | Status: DC | PRN
  Administered 2015-06-06: 150 mg via INTRAVENOUS
  Administered 2015-06-06: 40 mg via INTRAVENOUS
  Administered 2015-06-06: 50 mg via INTRAVENOUS

## 2015-06-06 MED ORDER — HYDROMORPHONE HCL 1 MG/ML IJ SOLN
INTRAMUSCULAR | Status: AC
  Filled 2015-06-06: qty 2

## 2015-06-06 SURGICAL SUPPLY — 62 items
BANDAGE ELASTIC 3 VELCRO ST LF (GAUZE/BANDAGES/DRESSINGS) ×3 IMPLANT
BANDAGE ELASTIC 4 VELCRO ST LF (GAUZE/BANDAGES/DRESSINGS) ×3 IMPLANT
BENZOIN TINCTURE PRP APPL 2/3 (GAUZE/BANDAGES/DRESSINGS) ×3 IMPLANT
BIT DRILL 1.1 (BIT) ×1
BIT DRILL 1.1MM (BIT) ×1
BIT DRILL 60X20X1.1XQC TMX (BIT) ×1 IMPLANT
BIT DRL 60X20X1.1XQC TMX (BIT) ×1
BLADE SURG ROTATE 9660 (MISCELLANEOUS) IMPLANT
BNDG ESMARK 4X9 LF (GAUZE/BANDAGES/DRESSINGS) ×3 IMPLANT
BNDG GAUZE ELAST 4 BULKY (GAUZE/BANDAGES/DRESSINGS) ×3 IMPLANT
CANISTER SUCTION WELLS/JOHNSON (MISCELLANEOUS) ×3 IMPLANT
CLOSURE WOUND 1/2 X4 (GAUZE/BANDAGES/DRESSINGS) ×1
CORDS BIPOLAR (ELECTRODE) ×3 IMPLANT
COVER SURGICAL LIGHT HANDLE (MISCELLANEOUS) ×3 IMPLANT
CUFF TOURNIQUET SINGLE 18IN (TOURNIQUET CUFF) ×3 IMPLANT
CUFF TOURNIQUET SINGLE 24IN (TOURNIQUET CUFF) IMPLANT
DRAIN TLS ROUND 10FR (DRAIN) IMPLANT
DRAPE OEC MINIVIEW 54X84 (DRAPES) ×3 IMPLANT
DRAPE SURG 17X11 SM STRL (DRAPES) ×3 IMPLANT
DRSG ADAPTIC 3X8 NADH LF (GAUZE/BANDAGES/DRESSINGS) ×3 IMPLANT
DRSG EMULSION OIL 3X3 NADH (GAUZE/BANDAGES/DRESSINGS) ×3 IMPLANT
GAUZE SPONGE 4X4 12PLY STRL (GAUZE/BANDAGES/DRESSINGS) ×3 IMPLANT
GAUZE SPONGE 4X4 16PLY XRAY LF (GAUZE/BANDAGES/DRESSINGS) IMPLANT
GLOVE BIOGEL PI IND STRL 6.5 (GLOVE) ×3 IMPLANT
GLOVE BIOGEL PI IND STRL 8.5 (GLOVE) ×1 IMPLANT
GLOVE BIOGEL PI INDICATOR 6.5 (GLOVE) ×6
GLOVE BIOGEL PI INDICATOR 8.5 (GLOVE) ×2
GLOVE SURG ORTHO 8.0 STRL STRW (GLOVE) ×3 IMPLANT
GOWN STRL REUS W/ TWL LRG LVL3 (GOWN DISPOSABLE) ×3 IMPLANT
GOWN STRL REUS W/ TWL XL LVL3 (GOWN DISPOSABLE) ×1 IMPLANT
GOWN STRL REUS W/TWL LRG LVL3 (GOWN DISPOSABLE) ×6
GOWN STRL REUS W/TWL XL LVL3 (GOWN DISPOSABLE) ×2
KIT BASIN OR (CUSTOM PROCEDURE TRAY) ×3 IMPLANT
KIT ROOM TURNOVER OR (KITS) ×3 IMPLANT
MANIFOLD NEPTUNE II (INSTRUMENTS) IMPLANT
NEEDLE HYPO 25X1 1.5 SAFETY (NEEDLE) ×3 IMPLANT
NS IRRIG 1000ML POUR BTL (IV SOLUTION) ×3 IMPLANT
PACK ORTHO EXTREMITY (CUSTOM PROCEDURE TRAY) ×3 IMPLANT
PAD ARMBOARD 7.5X6 YLW CONV (MISCELLANEOUS) ×6 IMPLANT
PAD CAST 3X4 CTTN HI CHSV (CAST SUPPLIES) ×1 IMPLANT
PAD CAST 4YDX4 CTTN HI CHSV (CAST SUPPLIES) ×1 IMPLANT
PADDING CAST COTTON 3X4 STRL (CAST SUPPLIES) ×2
PADDING CAST COTTON 4X4 STRL (CAST SUPPLIES) ×2
PLATE STRAIGHT LOCK 1.5 (Plate) ×3 IMPLANT
SCREW NL 1.5X11 WRIST (Screw) ×6 IMPLANT
SCREW NL 1.5X12 (Screw) ×9 IMPLANT
SCREW NL 1.5X13 (Screw) ×3 IMPLANT
SOAP 2 % CHG 4 OZ (WOUND CARE) ×3 IMPLANT
STRIP CLOSURE SKIN 1/2X4 (GAUZE/BANDAGES/DRESSINGS) ×2 IMPLANT
SUT ETHILON 4 0 PS 2 18 (SUTURE) IMPLANT
SUT MNCRL AB 3-0 PS2 18 (SUTURE) ×3 IMPLANT
SUT MNCRL AB 4-0 PS2 18 (SUTURE) ×3 IMPLANT
SUT VIC AB 2-0 FS1 27 (SUTURE) IMPLANT
SUT VICRYL 4-0 PS2 18IN ABS (SUTURE) IMPLANT
SYR CONTROL 10ML LL (SYRINGE) ×3 IMPLANT
SYSTEM CHEST DRAIN TLS 7FR (DRAIN) IMPLANT
TOWEL OR 17X24 6PK STRL BLUE (TOWEL DISPOSABLE) ×3 IMPLANT
TOWEL OR 17X26 10 PK STRL BLUE (TOWEL DISPOSABLE) ×3 IMPLANT
TUBE CONNECTING 12'X1/4 (SUCTIONS) ×1
TUBE CONNECTING 12X1/4 (SUCTIONS) ×2 IMPLANT
WIRE K .035MM 164206035 (MISCELLANEOUS) ×3 IMPLANT
YANKAUER SUCT BULB TIP NO VENT (SUCTIONS) ×3 IMPLANT

## 2015-06-06 NOTE — Anesthesia Preprocedure Evaluation (Addendum)
Anesthesia Evaluation  Patient identified by MRN, date of birth, ID band Patient awake    Reviewed: Allergy & Precautions, NPO status , Patient's Chart, lab work & pertinent test results  History of Anesthesia Complications (+) PONVNegative for: history of anesthetic complications  Airway Mallampati: I  TM Distance: >3 FB Neck ROM: full    Dental no notable dental hx. (+) Teeth Intact, Dental Advidsory Given   Pulmonary neg pulmonary ROS, former smoker,    Pulmonary exam normal       Cardiovascular negative cardio ROS Normal cardiovascular examRhythm:regular Rate:Normal     Neuro/Psych negative neurological ROS  negative psych ROS   GI/Hepatic negative GI ROS, Neg liver ROS,   Endo/Other  negative endocrine ROS  Renal/GU negative Renal ROS  negative genitourinary   Musculoskeletal   Abdominal   Peds  Hematology negative hematology ROS (+)   Anesthesia Other Findings   Reproductive/Obstetrics negative OB ROS                           Anesthesia Physical Anesthesia Plan  ASA: I  Anesthesia Plan: General   Post-op Pain Management:    Induction: Intravenous  Airway Management Planned: LMA  Additional Equipment:   Intra-op Plan:   Post-operative Plan: Extubation in OR  Informed Consent: I have reviewed the patients History and Physical, chart, labs and discussed the procedure including the risks, benefits and alternatives for the proposed anesthesia with the patient or authorized representative who has indicated his/her understanding and acceptance.   Dental advisory given  Plan Discussed with: CRNA  Anesthesia Plan Comments:        Anesthesia Quick Evaluation

## 2015-06-06 NOTE — Anesthesia Procedure Notes (Signed)
Procedure Name: LMA Insertion Date/Time: 06/06/2015 5:11 PM Performed by: Jefm Miles E Pre-anesthesia Checklist: Patient identified, Emergency Drugs available, Suction available, Patient being monitored and Timeout performed Patient Re-evaluated:Patient Re-evaluated prior to inductionOxygen Delivery Method: Circle system utilized Preoxygenation: Pre-oxygenation with 100% oxygen Intubation Type: IV induction Ventilation: Mask ventilation without difficulty LMA: LMA inserted LMA Size: 4.0 Number of attempts: 1 Placement Confirmation: positive ETCO2 and breath sounds checked- equal and bilateral Tube secured with: Tape Dental Injury: Teeth and Oropharynx as per pre-operative assessment

## 2015-06-06 NOTE — Transfer of Care (Signed)
Immediate Anesthesia Transfer of Care Note  Patient: Dana Calhoun  Procedure(s) Performed: Procedure(s): OPEN REDUCTION INTERNAL FIXATION (ORIF) METACARPAL RIGHT SMALL FINGER (Right)  Patient Location: PACU  Anesthesia Type:General  Level of Consciousness: awake  Airway & Oxygen Therapy: Patient Spontanous Breathing  Post-op Assessment: Report given to RN and Post -op Vital signs reviewed and stable  Post vital signs: Reviewed and stable  Last Vitals:  Filed Vitals:   06/06/15 1830  BP:   Pulse: 86  Temp:   Resp: 10    Complications: No apparent anesthesia complications

## 2015-06-06 NOTE — Discharge Instructions (Signed)
KEEP BANDAGE CLEAN AND DRY °CALL OFFICE FOR F/U APPT [phone] in 10 days °DR Oceanna Arruda CELL [phone] °KEEP HAND ELEVATED ABOVE HEART °OK TO APPLY ICE TO OPERATIVE AREA °CONTACT OFFICE IF ANY WORSENING PAIN OR CONCERNS. °

## 2015-06-06 NOTE — Anesthesia Postprocedure Evaluation (Signed)
  Anesthesia Post-op Note  Patient: Dana Calhoun  Procedure(s) Performed: Procedure(s) (LRB): OPEN REDUCTION INTERNAL FIXATION (ORIF) METACARPAL RIGHT SMALL FINGER (Right)  Patient Location: PACU  Anesthesia Type: General  Level of Consciousness: awake and alert   Airway and Oxygen Therapy: Patient Spontanous Breathing  Post-op Pain: mild  Post-op Assessment: Post-op Vital signs reviewed, Patient's Cardiovascular Status Stable, Respiratory Function Stable, Patent Airway and No signs of Nausea or vomiting  Last Vitals:  Filed Vitals:   06/06/15 1830  BP:   Pulse: 86  Temp:   Resp: 10    Post-op Vital Signs: stable   Complications: No apparent anesthesia complications

## 2015-06-07 NOTE — Op Note (Signed)
NAME:  Dana, Calhoun NO.:  [id number]  MEDICAL RECORD NO.:  [id number]  LOCATION:  [facility]                         FACILITY:  [facility]  PHYSICIAN:  Sharma Covert IV, M.D.DATE OF BIRTH:  11/10/1990  DATE OF PROCEDURE:  06/06/2015 DATE OF DISCHARGE:  06/06/2015                              OPERATIVE REPORT   PREOPERATIVE DIAGNOSIS:  Right small finger displaced metacarpal shaft fracture with angulatory deformity.  POSTOPERATIVE DIAGNOSIS:  Right small finger displaced metacarpal shaft fracture with angulatory deformity.  ATTENDING PHYSICIAN:  Sharma Covert, M.D., who has scrubbed and was present for the entire procedure.  ASSISTANT SURGEON:  None.  ANESTHESIA:  General via LMA.  SURGICAL PROCEDURES: 1. Open treatment right small finger metacarpal fracture requiring     internal fixation. 2. Radiographs, 3 views, right hand.  SURGICAL IMPLANTS:  Hand-out 1.5 mm 6 hole plate, 3 screws proximally, 3 screws distally nonlocking screws.  RADIOGRAPHIC INTERPRETATION:  AP, lateral, and oblique films of the hand did show dorsal plate fixation in good position and good alignment and cascade of the digits.  SURGICAL INDICATIONS:  [name] is a right-hand dominant female who sustained a closed injury to her right small finger metacarpal shaft. The patient was seen and evaluated in the office and recommended to undergo the above procedure.  Risks, benefits, and alternatives were discussed in detail with the patient and signed informed consent was obtained.  Risks included, but not limited to, bleeding, infection, damage to nearby nerves, arteries, or tendons, loss of motion of wrist and digits, incomplete relief, nonunion, malunion, hardware failure, malrotation, and need for further surgical intervention.  DESCRIPTION OF PROCEDURE:  The patient was properly identified in the preoperative holding area, marked with permanent marker made on the right small  finger to indicate the correct operative site.  The patient was then brought back to the operating room, placed supine on the anesthesia table.  General anesthesia was administered.  The patient received preoperative antibiotics prior to skin incision.  A well-padded tourniquet was placed in the right brachium and sealed with 1000 drape. Right upper extremity was then prepped and draped in normal sterile fashion.  Time-out was called, correct site was identified, and procedure then begun.  Attention was then turned to the right hand.  A longitudinal incision was made directly over the dorsal aspect of the small finger metacarpal shaft.  Dissection was carried down through the skin and subcutaneous tissue.  Deep down the extensor interval was then split longitudinally.  The fascial layer was incised directly over the bone.  Careful elevation was then carried out.  The fracture site was then exposed and takedown of the early fracture callus was then done. An open reduction was then performed and held in place with a reduction clamp.  A 6-hole plate was then applied dorsally.  It held temporarily in place with K-wires and its position confirmed using mini C-arm.  Once this was carried out, a 1.1 mm drill bit was then used and 1.5 mm nonlocking screws were then placed, 3 proximally, 3 distally for the secured fixation.  The wound was then thoroughly irrigated.  The fascia of the periosteal layer was then  closed with Monocryl subcutaneous tissues, closed with 4-0 Monocryl, and the skin was closed with simple 4- 0 Monocryl and benzoin and Steri-Strips.  Adaptic dressing, sterile compressive bandage then applied.  The patient tolerated the procedure well, placed in a well-padded ulnar gutter splint, extubated, and taken to the recovery room in good condition.  POSTPROCEDURE PLAN:  The patient will be discharged home, seen back in the office in 10 days for wound check, suture removal,  radiographs, and begin a postoperative ORIF protocol of the metacarpal shaft, plate, and screws.     [name], M.D.     FWO/MEDQ  D:  06/06/2015  T:  06/07/2015  Job:  [id number]

## 2015-06-09 ENCOUNTER — Encounter (HOSPITAL_COMMUNITY): Payer: Self-pay | Admitting: Orthopedic Surgery
